# Patient Record
Sex: Female | Born: 1965 | Race: White | Hispanic: No | Marital: Married | Smoking: Former smoker
Health system: Southern US, Community
[De-identification: ages and names within clinical notes are randomized; demographics above are authoritative.]

## PROBLEM LIST (undated history)

## (undated) DIAGNOSIS — I319 Disease of pericardium, unspecified: Secondary | ICD-10-CM

## (undated) DIAGNOSIS — J3089 Other allergic rhinitis: Secondary | ICD-10-CM

## (undated) DIAGNOSIS — C3490 Malignant neoplasm of unspecified part of unspecified bronchus or lung: Secondary | ICD-10-CM

## (undated) DIAGNOSIS — Z8619 Personal history of other infectious and parasitic diseases: Secondary | ICD-10-CM

## (undated) DIAGNOSIS — K635 Polyp of colon: Secondary | ICD-10-CM

## (undated) HISTORY — DX: Other allergic rhinitis: J30.89

## (undated) HISTORY — DX: Malignant neoplasm of unspecified part of unspecified bronchus or lung: C34.90

## (undated) HISTORY — DX: Personal history of other infectious and parasitic diseases: Z86.19

## (undated) HISTORY — DX: Polyp of colon: K63.5

---
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
## 2007-09-25 HISTORY — PX: OTHER SURGICAL HISTORY: SHX169

## 2013-04-01 ENCOUNTER — Ambulatory Visit: Payer: Self-pay | Admitting: Internal Medicine

## 2013-05-06 ENCOUNTER — Ambulatory Visit: Payer: Self-pay | Admitting: Physical Medicine and Rehabilitation

## 2015-12-05 ENCOUNTER — Ambulatory Visit: Payer: 59 | Admitting: Nurse Practitioner

## 2016-01-13 ENCOUNTER — Encounter: Payer: Self-pay | Admitting: Nurse Practitioner

## 2016-01-13 ENCOUNTER — Other Ambulatory Visit: Payer: Self-pay | Admitting: Nurse Practitioner

## 2016-01-13 ENCOUNTER — Ambulatory Visit (INDEPENDENT_AMBULATORY_CARE_PROVIDER_SITE_OTHER): Payer: 59 | Admitting: Nurse Practitioner

## 2016-01-13 VITALS — BP 112/74 | HR 96 | Temp 98.1°F | Ht 64.25 in | Wt 169.3 lb

## 2016-01-13 DIAGNOSIS — C3490 Malignant neoplasm of unspecified part of unspecified bronchus or lung: Secondary | ICD-10-CM | POA: Insufficient documentation

## 2016-01-13 DIAGNOSIS — Z7189 Other specified counseling: Secondary | ICD-10-CM

## 2016-01-13 DIAGNOSIS — Z91048 Other nonmedicinal substance allergy status: Secondary | ICD-10-CM | POA: Diagnosis not present

## 2016-01-13 DIAGNOSIS — N842 Polyp of vagina: Secondary | ICD-10-CM

## 2016-01-13 DIAGNOSIS — G479 Sleep disorder, unspecified: Secondary | ICD-10-CM | POA: Insufficient documentation

## 2016-01-13 DIAGNOSIS — Z5189 Encounter for other specified aftercare: Secondary | ICD-10-CM

## 2016-01-13 DIAGNOSIS — Z7689 Persons encountering health services in other specified circumstances: Secondary | ICD-10-CM

## 2016-01-13 DIAGNOSIS — Z9109 Other allergy status, other than to drugs and biological substances: Secondary | ICD-10-CM

## 2016-01-13 DIAGNOSIS — Z923 Personal history of irradiation: Secondary | ICD-10-CM

## 2016-01-13 MED ORDER — LORAZEPAM 1 MG PO TABS: 1.0000 mg | ORAL_TABLET | Freq: Every day | ORAL | Status: AC

## 2016-01-13 NOTE — Addendum Note (Signed)
Addended by: Rubbie Battiest on: 01/13/2016 09:26 AM   Modules accepted: Orders

## 2016-01-13 NOTE — Assessment & Plan Note (Signed)
Removed in 2014 No concerns recently

## 2016-01-13 NOTE — Progress Notes (Signed)
Pre visit review using our clinic review tool, if applicable. No additional management support is needed unless otherwise documented below in the visit note. 

## 2016-01-13 NOTE — Assessment & Plan Note (Signed)
OTCs, stable, no further concerns at this time

## 2016-01-13 NOTE — Assessment & Plan Note (Addendum)
Discussed acute and chronic issues. Reviewed health maintenance measures, PFSHx, and immunizations. Obtain records and some labs

## 2016-01-13 NOTE — Assessment & Plan Note (Signed)
Seen by Dr. Yates Decamp at [facility] in [place] Left lung Left chest port  Referral to onc here for intermittent flushing ONLY FU prn worsening/failure to improve.

## 2016-01-13 NOTE — Progress Notes (Signed)
Patient ID: Becky Miller, female    DOB: September 11, 1966  Age: 50 y.o. MRN: [id number]  CC: New Patient (Initial Visit)   HPI Becky Miller presents for establishing care today and CC refills and labs.   1) New Pt Info:   Immunizations-tdap 2009 first surgery- back   Mammogram- Wants to discuss (Pet scans) goes back in June, small cell lung cancer  Pap- 2014- normals only   Colonoscopy- covered in PET scan 2017, unknown date in past   Last eye exam was 2015   LMP- 2013 Post-menopausal  2) Chronic Problems-  Cancer- Lung Cancer, chemo and radiation completion   Allergies- Seasonal   Polyps in the colon- colonoscopy, vaginal polyp in 2014- removed also   3) Acute Problems-  Thyroid- it was not normal recently  B12- vegetarian, from neck surgery tingling   Needs Ativan refilled, uses 1 at night for sleep   Will need port flushed occasionally  Seeing Dr. Yates Decamp at the [facility] in [place] every 2 months.   History Becky Miller has a past medical history of Lung cancer ([place]); History of chicken pox; Environmental and seasonal allergies; and Polyp of colon.   She has past surgical history that includes dicectomy (2003); Lamectomy (2009); and Tonsillectomy and adenoidectomy (1981).   Her family history includes Hyperlipidemia in her father and mother; Hypertension in her father and mother; Kidney disease in her brother; Stroke in her brother.She reports that she quit smoking about 5 years ago. She does not have any smokeless tobacco history on file. She reports that she does not use illicit drugs. Her alcohol history is not on file.  No outpatient prescriptions prior to visit.   No facility-administered medications prior to visit.    ROS Review of Systems  Constitutional: Negative for fever, chills, diaphoresis and fatigue.  Respiratory: Negative for chest tightness, shortness of breath and wheezing.   Cardiovascular: Negative for chest pain, palpitations and leg swelling.   Gastrointestinal: Negative for nausea, vomiting and diarrhea.  Skin: Negative for rash.  Neurological: Positive for numbness. Negative for dizziness, weakness and headaches.       From previous surgeries back/neck and radiation  Psychiatric/Behavioral: Positive for sleep disturbance. The patient is not nervous/anxious.     Objective:  BP 112/74 mmHg  Pulse 96  Temp(Src) 98.1 F (36.7 C) (Oral)  Ht 5' 4.25" (1.632 m)  Wt 169 lb 5 oz (76.8 kg)  BMI 28.84 kg/m2  SpO2 96%  LMP 09/25/2011 (Approximate) Repeat pulse 96  Physical Exam  Constitutional: She is oriented to person, place, and time. She appears well-developed and well-nourished. No distress.  HENT:  Head: Normocephalic and atraumatic.  Right Ear: External ear normal.  Left Ear: External ear normal.  Loss of hair  Eyes: Right eye exhibits no discharge. Left eye exhibits no discharge. No scleral icterus.  Glasses  Cardiovascular: Normal rate, regular rhythm and normal heart sounds.   Pulmonary/Chest: Effort normal and breath sounds normal. No respiratory distress. She has no wheezes. She has no rales. She exhibits no tenderness.  Neurological: She is alert and oriented to person, place, and time. No cranial nerve deficit. She exhibits normal muscle tone. Coordination normal.  Skin: Skin is warm and dry. No rash noted. She is not diaphoretic.  Psychiatric: She has a normal mood and affect. Her behavior is normal. Judgment and thought content normal.   Assessment & Plan:   Becky Miller was seen today for new patient (initial visit).  Diagnoses and all orders for this  visit:  Encounter to establish care  Small cell lung cancer, unspecified laterality ([place]) Comments: Left lung Orders: -     Lipid Profile -     CBC w/Diff -     TSH -     T4, free -     B12 -     Comp Met (CMET) -     Ambulatory referral to Oncology  Environmental allergies  Vaginal polyp  S/P radiation therapy  Sleep disturbance  Other orders -      Cancel: Basic Metabolic Panel (BMET) -     Cancel: Urinalysis, Routine w reflex microscopic -     Cancel: Hepatic function panel -     LORazepam (ATIVAN) 1 MG tablet; Take 1 tablet (1 mg total) by mouth at bedtime.   I have changed Becky Miller's LORazepam. I am also having her maintain her memantine.  Meds ordered this encounter  Medications  . DISCONTD: LORazepam (ATIVAN) 1 MG tablet    Sig:   . memantine (NAMENDA) 10 MG tablet    Sig:   . LORazepam (ATIVAN) 1 MG tablet    Sig: Take 1 tablet (1 mg total) by mouth at bedtime.    Dispense:  30 tablet    Refill:  2    Order Specific Question:  Supervising Provider    Answer:  Crecencio Mc [[id number]]     Follow-up: Return in about 3 months (around 04/13/2016) for Follow up .

## 2016-01-13 NOTE — Patient Instructions (Signed)
Welcome to [facility]! Nice to meet you.   See you in 3 months. We will put in a "referral" for as needed port flushing.

## 2016-01-13 NOTE — Assessment & Plan Note (Signed)
Stable on Ativan 1 mg at night Will continue x 3 months CSC signed today FU prn worsening/failure to improve.

## 2016-01-14 LAB — COMPREHENSIVE METABOLIC PANEL
ALBUMIN: 4.2 g/dL (ref 3.5–5.5)
ALT: 11 IU/L (ref 0–32)
AST: 14 IU/L (ref 0–40)
Albumin/Globulin Ratio: 1.3 (ref 1.2–2.2)
Alkaline Phosphatase: 84 IU/L (ref 39–117)
BUN / CREAT RATIO: 20 (ref 9–23)
BUN: 11 mg/dL (ref 6–24)
Bilirubin Total: <0.2 mg/dL (ref 0.0–1.2)
CO2: 21 mmol/L (ref 18–29)
CREATININE: 0.56 mg/dL — AB (ref 0.57–1.00)
Calcium: 9.8 mg/dL (ref 8.7–10.2)
Chloride: 98 mmol/L (ref 96–106)
GFR, EST AFRICAN AMERICAN: 124 mL/min/{1.73_m2} (ref >59)
GFR, EST NON AFRICAN AMERICAN: 108 mL/min/{1.73_m2} (ref >59)
GLOBULIN, TOTAL: 3.2 g/dL (ref 1.5–4.5)
Glucose: 105 mg/dL — ABNORMAL HIGH (ref 65–99)
Potassium: 4.9 mmol/L (ref 3.5–5.2)
SODIUM: 136 mmol/L (ref 134–144)
TOTAL PROTEIN: 7.4 g/dL (ref 6.0–8.5)

## 2016-01-14 LAB — CBC WITH DIFFERENTIAL/PLATELET
BASOS ABS: 0 10*3/uL (ref 0.0–0.2)
Basos: 1 %
EOS (ABSOLUTE): 0.2 10*3/uL (ref 0.0–0.4)
Eos: 3 %
Hematocrit: 34.3 % (ref 34.0–46.6)
Hemoglobin: 11.1 g/dL (ref 11.1–15.9)
IMMATURE GRANS (ABS): 0 10*3/uL (ref 0.0–0.1)
IMMATURE GRANULOCYTES: 0 %
LYMPHS: 13 %
Lymphocytes Absolute: 0.9 10*3/uL (ref 0.7–3.1)
MCH: 27.8 pg (ref 26.6–33.0)
MCHC: 32.4 g/dL (ref 31.5–35.7)
MCV: 86 fL (ref 79–97)
MONOCYTES: 8 %
Monocytes Absolute: 0.5 10*3/uL (ref 0.1–0.9)
NEUTROS PCT: 75 %
Neutrophils Absolute: 5.3 10*3/uL (ref 1.4–7.0)
PLATELETS: 361 10*3/uL (ref 150–379)
RBC: 3.99 x10E6/uL (ref 3.77–5.28)
RDW: 14.1 % (ref 12.3–15.4)
WBC: 7 10*3/uL (ref 3.4–10.8)

## 2016-01-14 LAB — CBC AND DIFFERENTIAL
HCT: 34 % — AB (ref 36–46)
Hemoglobin: 11.1 g/dL — AB (ref 12.0–16.0)
NEUTROS ABS: 5 /uL
PLATELETS: 361 10*3/uL (ref 150–399)
WBC: 7 10^3/mL

## 2016-01-14 LAB — BASIC METABOLIC PANEL
BUN: 11 mg/dL (ref 4–21)
Creatinine: 0.6 mg/dL (ref 0.5–1.1)
GLUCOSE: 105 mg/dL
Potassium: 4.9 mmol/L (ref 3.4–5.3)
Sodium: 136 mmol/L — AB (ref 137–147)

## 2016-01-14 LAB — TSH
TSH: 2.34 u[IU]/mL (ref 0.41–5.90)
TSH: 2.34 u[IU]/mL (ref 0.450–4.500)

## 2016-01-14 LAB — HEPATIC FUNCTION PANEL
ALK PHOS: 84 U/L (ref 25–125)
ALT: 11 U/L (ref 7–35)
AST: 14 U/L (ref 13–35)
BILIRUBIN, TOTAL: 0.2 mg/dL

## 2016-01-14 LAB — LIPID PANEL W/O CHOL/HDL RATIO
Cholesterol, Total: 263 mg/dL — ABNORMAL HIGH (ref 100–199)
HDL: 44 mg/dL (ref >39)
LDL Calculated: 200 mg/dL — ABNORMAL HIGH (ref 0–99)
Triglycerides: 96 mg/dL (ref 0–149)
VLDL CHOLESTEROL CAL: 19 mg/dL (ref 5–40)

## 2016-01-14 LAB — B12 AND FOLATE PANEL
FOLATE: 16.7 ng/mL (ref >3.0)
Vitamin B-12: 303 pg/mL (ref 211–946)

## 2016-01-14 LAB — LIPID PANEL
Cholesterol: 263 mg/dL — AB (ref 0–200)
HDL: 44 mg/dL (ref 35–70)
LDL CALC: 200 mg/dL
TRIGLYCERIDES: 96 mg/dL (ref 40–160)

## 2016-01-14 LAB — T3: T3, Total: 118 ng/dL (ref 71–180)

## 2016-01-14 LAB — T4, FREE: FREE T4: 0.99 ng/dL (ref 0.82–1.77)

## 2016-01-17 ENCOUNTER — Telehealth: Payer: Self-pay | Admitting: Nurse Practitioner

## 2016-01-17 NOTE — Telephone Encounter (Signed)
No labs found in epic or in box

## 2016-01-17 NOTE — Telephone Encounter (Signed)
Pt called in about wanting to follow up on her lab results she got labs done on 04/21. Call pt @ [phone]. Thank you!

## 2016-01-18 ENCOUNTER — Ambulatory Visit: Payer: 59 | Admitting: Oncology

## 2016-01-18 ENCOUNTER — Ambulatory Visit (INDEPENDENT_AMBULATORY_CARE_PROVIDER_SITE_OTHER): Payer: 59 | Admitting: Nurse Practitioner

## 2016-01-18 ENCOUNTER — Encounter: Payer: Self-pay | Admitting: Nurse Practitioner

## 2016-01-18 VITALS — BP 98/68 | HR 104 | Temp 98.0°F | Ht 64.25 in | Wt 169.1 lb

## 2016-01-18 DIAGNOSIS — M5431 Sciatica, right side: Secondary | ICD-10-CM | POA: Diagnosis not present

## 2016-01-18 NOTE — Patient Instructions (Signed)
Aleve, Ice, Stretching... Chiropractor tomorrow   We will re-check your Cholesterol and thyroid panel in 6 months

## 2016-01-18 NOTE — Telephone Encounter (Signed)
I'm seeing her today- I will ask her what labs

## 2016-01-18 NOTE — Progress Notes (Signed)
Patient ID: Becky Miller, female    DOB: May 15, 1966  Age: 50 y.o. MRN: [id number]  CC: Hip Pain   HPI Becky Miller presents for CC of back pain x 1 day.   1) Right leg pain   Chiropractor tomorrow  Oxycodone- somewhat helpful   Advil- helpful  In bed until Lunch time, stretching   3/4 severity Right lower back to hip and then wraps knee down to foot   Repeat cholesterol and thyroid panel in 6 months    History Becky Miller has a past medical history of Lung cancer ([place]); History of chicken pox; Environmental and seasonal allergies; and Polyp of colon.   She has past surgical history that includes dicectomy (2003); Lamectomy (2009); and Tonsillectomy and adenoidectomy (1981).   Her family history includes Hyperlipidemia in her father and mother; Hypertension in her father and mother; Kidney disease in her brother; Stroke in her brother.She reports that she quit smoking about 5 years ago. She does not have any smokeless tobacco history on file. She reports that she does not use illicit drugs. Her alcohol history is not on file.  Outpatient Prescriptions Prior to Visit  Medication Sig Dispense Refill  . LORazepam (ATIVAN) 1 MG tablet Take 1 tablet (1 mg total) by mouth at bedtime. 30 tablet 2  . memantine (NAMENDA) 10 MG tablet      No facility-administered medications prior to visit.    ROS Review of Systems  Constitutional: Negative for fever, chills, diaphoresis and fatigue.  Respiratory: Negative for chest tightness, shortness of breath and wheezing.   Cardiovascular: Negative for chest pain, palpitations and leg swelling.  Gastrointestinal: Negative for nausea, vomiting and diarrhea.  Musculoskeletal: Positive for myalgias and back pain. Negative for joint swelling, arthralgias, gait problem, neck pain and neck stiffness.  Skin: Negative for rash.  Neurological: Negative for dizziness, weakness, numbness and headaches.  Psychiatric/Behavioral: The patient is not nervous/anxious.      Objective:  BP 98/68 mmHg  Pulse 104  Temp(Src) 98 F (36.7 C) (Oral)  Ht 5' 4.25" (1.632 m)  Wt 169 lb 1.9 oz (76.712 kg)  BMI 28.80 kg/m2  SpO2 96%  LMP 09/25/2011 (Approximate)  Physical Exam  Constitutional: She is oriented to person, place, and time. She appears well-developed and well-nourished. No distress.  HENT:  Head: Normocephalic and atraumatic.  Right Ear: External ear normal.  Left Ear: External ear normal.  Cardiovascular: Regular rhythm.   Slightly tachycardic  Pulmonary/Chest: Effort normal and breath sounds normal. No respiratory distress. She has no wheezes. She has no rales. She exhibits no tenderness.  Musculoskeletal: Normal range of motion. She exhibits no edema or tenderness.  Neurological: She is alert and oriented to person, place, and time. No cranial nerve deficit. She exhibits normal muscle tone. Coordination normal.  Iliopsoas 5/5 Bilateral, Tib anterior 5/5 bilateral, EHL 5/5 bilateral, no ankle clonus, intact heel/toe/sequential walking, sensation intact upper and lower extremities. Straight leg raise negative bilaterally.   Skin: Skin is warm and dry. No rash noted. She is not diaphoretic.  Psychiatric: She has a normal mood and affect. Her behavior is normal. Judgment and thought content normal.      Assessment & Plan:   Jalie was seen today for hip pain.  Diagnoses and all orders for this visit:  Back pain with right-sided sciatica  I am having Ms. Klutz maintain her memantine and LORazepam.  No orders of the defined types were placed in this encounter.     Follow-up: Return if symptoms  worsen or fail to improve.

## 2016-01-18 NOTE — Telephone Encounter (Signed)
Requested results from [facility]. Received & abstracted.

## 2016-01-25 NOTE — Assessment & Plan Note (Signed)
New onset Patient reports reports radiculopathy of right side Neuro exam was normal It is already improving per patient she would not like to seek further treatment at this time. She reports she is going to see the chiropractor tomorrow. Conservative therapy until then and follow-up if worsening.

## 2016-01-27 ENCOUNTER — Encounter: Payer: Self-pay | Admitting: Nurse Practitioner

## 2016-01-31 ENCOUNTER — Encounter: Payer: Self-pay | Admitting: Nurse Practitioner

## 2016-02-01 ENCOUNTER — Encounter: Payer: Self-pay | Admitting: Nurse Practitioner

## 2016-02-22 ENCOUNTER — Telehealth: Payer: Self-pay | Admitting: *Deleted

## 2016-02-22 NOTE — Telephone Encounter (Signed)
I spoke with the patient about her laboratory results. She informed me that she was having abdominal pain. I initially informed her that I thought that she could wait until Monday to be seen. She then informed me that she had acholic stool. I urged her to be seen as soon as possible in a local emergency department as she needs labs and a CT scan. Patient acknowledged.

## 2016-02-22 NOTE — Telephone Encounter (Signed)
Patient also wants a physician to review the results to see if there is something she should be concerned about before going to her Oncologist. Lab results are in Carrie's box.

## 2016-02-22 NOTE — Telephone Encounter (Signed)
Patient has requested a phone consult, she was in [place] over the weekend, she went to urgent care there.Her Liver  enzymes were elevated. She's having the labs faxed over along with Hep C results as well. [phone]

## 2016-02-22 NOTE — Telephone Encounter (Signed)
Lab given to Dr. Jonathon Jordan CMA she has a visit in the future with Dr. Lacinda Axon. thanks

## 2016-04-02 LAB — HEPATIC FUNCTION PANEL
ALK PHOS: 68 U/L (ref 25–125)
ALT: 25 U/L (ref 7–35)
AST: 22 U/L (ref 13–35)
Bilirubin, Total: 0.4 mg/dL

## 2016-04-02 LAB — BASIC METABOLIC PANEL
BUN: 9 mg/dL (ref 4–21)
CREATININE: 0.5 mg/dL (ref 0.5–1.1)
Glucose: 117 mg/dL
POTASSIUM: 4.2 mmol/L (ref 3.4–5.3)
Sodium: 135 mmol/L — AB (ref 137–147)

## 2016-04-02 LAB — CBC AND DIFFERENTIAL
HCT: 32 % — AB (ref 36–46)
HEMOGLOBIN: 10.5 g/dL — AB (ref 12.0–16.0)
Neutrophils Absolute: 6 /uL
Platelets: 227 10*3/uL (ref 150–399)
WBC: 8.5 10*3/mL

## 2016-04-05 ENCOUNTER — Telehealth: Payer: Self-pay | Admitting: Family Medicine

## 2016-04-05 NOTE — Telephone Encounter (Signed)
Please advise appt needed prior, thanks

## 2016-04-05 NOTE — Telephone Encounter (Signed)
Pt called wanting to get labs done. Need orders sent to [facility] [address] please and thank you! Need fasting labs. Or pt can pick up form.  Call pt @ [phone].

## 2016-04-05 NOTE — Telephone Encounter (Signed)
Please advise and order, thanks

## 2016-04-05 NOTE — Telephone Encounter (Signed)
Will need to be seen prior. I have not seen this patient.

## 2016-04-06 NOTE — Telephone Encounter (Signed)
Ok. Pt was notified and pt has a appt on 04/10/16.

## 2016-04-10 ENCOUNTER — Ambulatory Visit (INDEPENDENT_AMBULATORY_CARE_PROVIDER_SITE_OTHER): Payer: 59 | Admitting: Family Medicine

## 2016-04-10 ENCOUNTER — Encounter: Payer: Self-pay | Admitting: Family Medicine

## 2016-04-10 VITALS — BP 124/72 | HR 103 | Temp 97.8°F | Wt 152.5 lb

## 2016-04-10 DIAGNOSIS — Z0000 Encounter for general adult medical examination without abnormal findings: Secondary | ICD-10-CM | POA: Diagnosis not present

## 2016-04-10 NOTE — Patient Instructions (Signed)
Call with concerns/issues.  Continue your current meds.  Take care  Dr. Lacinda Axon  Health Maintenance, Female Adopting a healthy lifestyle and getting preventive care can go a long way to promote health and wellness. Talk with your health care provider about what schedule of regular examinations is right for you. This is a good chance for you to check in with your provider about disease prevention and staying healthy. In between checkups, there are plenty of things you can do on your own. Experts have done a lot of research about which lifestyle changes and preventive measures are most likely to keep you healthy. Ask your health care provider for more information. WEIGHT AND DIET  Eat a healthy diet  Be sure to include plenty of vegetables, fruits, low-fat dairy products, and lean protein.  Do not eat a lot of foods high in solid fats, added sugars, or salt.  Get regular exercise. This is one of the most important things you can do for your health.  Most adults should exercise for at least 150 minutes each week. The exercise should increase your heart rate and make you sweat (moderate-intensity exercise).  Most adults should also do strengthening exercises at least twice a week. This is in addition to the moderate-intensity exercise.  Maintain a healthy weight  Body mass index (BMI) is a measurement that can be used to identify possible weight problems. It estimates body fat based on height and weight. Your health care provider can help determine your BMI and help you achieve or maintain a healthy weight.  For females 52 years of age and older:   A BMI below 18.5 is considered underweight.  A BMI of 18.5 to 24.9 is normal.  A BMI of 25 to 29.9 is considered overweight.  A BMI of 30 and above is considered obese.  Watch levels of cholesterol and blood lipids  You should start having your blood tested for lipids and cholesterol at 52 years of age, then have this test every 5  years.  You may need to have your cholesterol levels checked more often if:  Your lipid or cholesterol levels are high.  You are older than 52 years of age.  You are at high risk for heart disease.  CANCER SCREENING   Lung Cancer  Lung cancer screening is recommended for adults 52-3 years old who are at high risk for lung cancer because of a history of smoking.  A yearly low-dose CT scan of the lungs is recommended for people who:  Currently smoke.  Have quit within the past 15 years.  Have at least a 30-pack-year history of smoking. A pack year is smoking an average of one pack of cigarettes a day for 1 year.  Yearly screening should continue until it has been 15 years since you quit.  Yearly screening should stop if you develop a health problem that would prevent you from having lung cancer treatment.  Breast Cancer  Practice breast self-awareness. This means understanding how your breasts normally appear and feel.  It also means doing regular breast self-exams. Let your health care provider know about any changes, no matter how small.  If you are in your 20s or 30s, you should have a clinical breast exam (CBE) by a health care provider every 1-3 years as part of a regular health exam.  If you are 35 or older, have a CBE every year. Also consider having a breast X-ray (mammogram) every year.  If you have a family history  of breast cancer, talk to your health care provider about genetic screening.  If you are at high risk for breast cancer, talk to your health care provider about having an MRI and a mammogram every year.  Breast cancer gene (BRCA) assessment is recommended for women who have family members with BRCA-related cancers. BRCA-related cancers include:  Breast.  Ovarian.  Tubal.  Peritoneal cancers.  Results of the assessment will determine the need for genetic counseling and BRCA1 and BRCA2 testing. Cervical Cancer Your health care provider may  recommend that you be screened regularly for cancer of the pelvic organs (ovaries, uterus, and vagina). This screening involves a pelvic examination, including checking for microscopic changes to the surface of your cervix (Pap test). You may be encouraged to have this screening done every 3 years, beginning at age 52.  For women ages 69-65, health care providers may recommend pelvic exams and Pap testing every 3 years, or they may recommend the Pap and pelvic exam, combined with testing for human papilloma virus (HPV), every 5 years. Some types of HPV increase your risk of cervical cancer. Testing for HPV may also be done on women of any age with unclear Pap test results.  Other health care providers may not recommend any screening for nonpregnant women who are considered low risk for pelvic cancer and who do not have symptoms. Ask your health care provider if a screening pelvic exam is right for you.  If you have had past treatment for cervical cancer or a condition that could lead to cancer, you need Pap tests and screening for cancer for at least 20 years after your treatment. If Pap tests have been discontinued, your risk factors (such as having a new sexual partner) need to be reassessed to determine if screening should resume. Some women have medical problems that increase the chance of getting cervical cancer. In these cases, your health care provider may recommend more frequent screening and Pap tests. Colorectal Cancer  This type of cancer can be detected and often prevented.  Routine colorectal cancer screening usually begins at 52 years of age and continues through 52 years of age.  Your health care provider may recommend screening at an earlier age if you have risk factors for colon cancer.  Your health care provider may also recommend using home test kits to check for hidden blood in the stool.  A small camera at the end of a tube can be used to examine your colon directly  (sigmoidoscopy or colonoscopy). This is done to check for the earliest forms of colorectal cancer.  Routine screening usually begins at age 52.  Direct examination of the colon should be repeated every 5-10 years through 52 years of age. However, you may need to be screened more often if early forms of precancerous polyps or small growths are found. Skin Cancer  Check your skin from head to toe regularly.  Tell your health care provider about any new moles or changes in moles, especially if there is a change in a mole's shape or color.  Also tell your health care provider if you have a mole that is larger than the size of a pencil eraser.  Always use sunscreen. Apply sunscreen liberally and repeatedly throughout the day.  Protect yourself by wearing long sleeves, pants, a wide-brimmed hat, and sunglasses whenever you are outside. HEART DISEASE, DIABETES, AND HIGH BLOOD PRESSURE   High blood pressure causes heart disease and increases the risk of stroke. High blood pressure is  more likely to develop in:  People who have blood pressure in the high end of the normal range (130-139/85-89 mm Hg).  People who are overweight or obese.  People who are African American.  If you are 56-37 years of age, have your blood pressure checked every 3-5 years. If you are 35 years of age or older, have your blood pressure checked every year. You should have your blood pressure measured twice--once when you are at a hospital or clinic, and once when you are not at a hospital or clinic. Record the average of the two measurements. To check your blood pressure when you are not at a hospital or clinic, you can use:  An automated blood pressure machine at a pharmacy.  A home blood pressure monitor.  If you are between 43 years and 20 years old, ask your health care provider if you should take aspirin to prevent strokes.  Have regular diabetes screenings. This involves taking a blood sample to check your  fasting blood sugar level.  If you are at a normal weight and have a low risk for diabetes, have this test once every three years after 52 years of age.  If you are overweight and have a high risk for diabetes, consider being tested at a younger age or more often. PREVENTING INFECTION  Hepatitis B  If you have a higher risk for hepatitis B, you should be screened for this virus. You are considered at high risk for hepatitis B if:  You were born in a country where hepatitis B is common. Ask your health care provider which countries are considered high risk.  Your parents were born in a high-risk country, and you have not been immunized against hepatitis B (hepatitis B vaccine).  You have HIV or AIDS.  You use needles to inject street drugs.  You live with someone who has hepatitis B.  You have had sex with someone who has hepatitis B.  You get hemodialysis treatment.  You take certain medicines for conditions, including cancer, organ transplantation, and autoimmune conditions. Hepatitis C  Blood testing is recommended for:  Everyone born from [birth year] through 1967.  Anyone with known risk factors for hepatitis C. Sexually transmitted infections (STIs)  You should be screened for sexually transmitted infections (STIs) including gonorrhea and chlamydia if:  You are sexually active and are younger than 52 years of age.  You are older than 52 years of age and your health care provider tells you that you are at risk for this type of infection.  Your sexual activity has changed since you were last screened and you are at an increased risk for chlamydia or gonorrhea. Ask your health care provider if you are at risk.  If you do not have HIV, but are at risk, it may be recommended that you take a prescription medicine daily to prevent HIV infection. This is called pre-exposure prophylaxis (PrEP). You are considered at risk if:  You are sexually active and do not regularly use condoms or  know the HIV status of your partner(s).  You take drugs by injection.  You are sexually active with a partner who has HIV. Talk with your health care provider about whether you are at high risk of being infected with HIV. If you choose to begin PrEP, you should first be tested for HIV. You should then be tested every 3 months for as long as you are taking PrEP.  PREGNANCY   If you are premenopausal and you  may become pregnant, ask your health care provider about preconception counseling.  If you may become pregnant, take 400 to 800 micrograms (mcg) of folic acid every day.  If you want to prevent pregnancy, talk to your health care provider about birth control (contraception). OSTEOPOROSIS AND MENOPAUSE   Osteoporosis is a disease in which the bones lose minerals and strength with aging. This can result in serious bone fractures. Your risk for osteoporosis can be identified using a bone density scan.  If you are 6 years of age or older, or if you are at risk for osteoporosis and fractures, ask your health care provider if you should be screened.  Ask your health care provider whether you should take a calcium or vitamin D supplement to lower your risk for osteoporosis.  Menopause may have certain physical symptoms and risks.  Hormone replacement therapy may reduce some of these symptoms and risks. Talk to your health care provider about whether hormone replacement therapy is right for you.  HOME CARE INSTRUCTIONS   Schedule regular health, dental, and eye exams.  Stay current with your immunizations.   Do not use any tobacco products including cigarettes, chewing tobacco, or electronic cigarettes.  If you are pregnant, do not drink alcohol.  If you are breastfeeding, limit how much and how often you drink alcohol.  Limit alcohol intake to no more than 1 drink per day for nonpregnant women. One drink equals 12 ounces of beer, 5 ounces of wine, or 1 ounces of hard liquor.  Do  not use street drugs.  Do not share needles.  Ask your health care provider for help if you need support or information about quitting drugs.  Tell your health care provider if you often feel depressed.  Tell your health care provider if you have ever been abused or do not feel safe at home.   This information is not intended to replace advice given to you by your health care provider. Make sure you discuss any questions you have with your health care provider.   Document Released: 03/26/2011 Document Revised: 10/01/2014 Document Reviewed: 08/12/2013 Elsevier Interactive Patient Education [employer].

## 2016-04-10 NOTE — Progress Notes (Signed)
Pre visit review using our clinic review tool, if applicable. No additional management support is needed unless otherwise documented below in the visit note. 

## 2016-04-11 ENCOUNTER — Encounter: Payer: Self-pay | Admitting: Family Medicine

## 2016-04-11 DIAGNOSIS — Z0000 Encounter for general adult medical examination without abnormal findings: Secondary | ICD-10-CM | POA: Insufficient documentation

## 2016-04-11 NOTE — Progress Notes (Signed)
Subjective:  Patient ID: Becky Miller, female    DOB: 05-17-1966  Age: 50 y.o. MRN: [id number]  CC: Establish care (with me); Annual exam.  HPI Becky Miller is a 50 y.o. female presents to the clinic today to establish care with me. She has no concerns today. 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 She is followed by the [facility] in [place].   Preventative Healthcare  Pap smear: In need of.  Mammogram: In need of.  Colonoscopy: Due for.  Immunizations  Tetanus - Up to date.  Pneumococcal - Candidate for.  Hepatitis C screening - Done.   Labs: Has had recent labs.  Smoking/tobacco use: Former smoker.   PMH, Surgical Hx, Family Hx, Social History reviewed and updated as below.  Past Medical History  Diagnosis Date  . Lung cancer ([place])   . History of chicken pox   . Environmental and seasonal allergies   . Polyp of colon    Past Surgical History  Procedure Laterality Date  . Lamectomy  2009  . Tonsillectomy and adenoidectomy  1981   Family History  Problem Relation Age of Onset  . Colon cancer      Parents  . Hypercholesterolemia      Parents  . Hypertension      Parents  . Kidney disease Brother   . Stroke Brother   . Hyperlipidemia Mother   . Hyperlipidemia Father   . Hypertension Mother   . Hypertension Father    Social History  Substance Use Topics  . Smoking status: Former Smoker    Quit date: 09/24/2010  . Smokeless tobacco: Not on file  . Alcohol Use: Not on file   Review of Systems  Constitutional: Positive for fever.  Gastrointestinal: Positive for abdominal pain.  All other systems reviewed and are negative.  Objective:   Today's Vitals: BP 124/72 mmHg  Pulse 103  Temp(Src) 97.8 F (36.6 C) (Oral)  Wt 152 lb 8 oz (69.174 kg)  SpO2 97%  LMP 09/25/2011 (Approximate)  Physical Exam  Constitutional: She is oriented to person, place, and time. She appears well-developed.  NAD. Does  appear fatigued.   HENT:  Head: Normocephalic and atraumatic.  Eyes: Conjunctivae are normal. No scleral icterus.  Neck: Normal range of motion.  Cardiovascular: Normal rate and regular rhythm.   Pulmonary/Chest: Effort normal. She has no wheezes. She has no rales.  Abdominal: Soft. She exhibits no distension.  Tender to palpation in the epigastric region.  Musculoskeletal: Normal range of motion.  Neurological: She is alert and oriented to person, place, and time.  Psychiatric: She has a normal mood and affect.  Vitals reviewed.  Assessment & Plan:   Problem List Items Addressed This Visit    Annual physical exam - Primary    Patient had a long discussion about preventative healthcare items. She would like to wait and discuss these with her oncologist. I'm in agreement as I'm not sure if these are truly needed at this point given pancreatic metastasis. Patient to follow-up with me annually and PRN as needed. Additionally, patient is traveling to and from [place] often to get her treatments and care. I encouraged her to consolidate her care and essentially stay in one place as travel puts her at risk and is going to be difficult down the road.         Outpatient Encounter Prescriptions as of 04/10/2016  Medication Sig  . LORazepam (ATIVAN) 1 MG tablet  Take 1 tablet (1 mg total) by mouth at bedtime.  Marland Kitchen omeprazole (PRILOSEC) 40 MG capsule   . ondansetron (ZOFRAN-ODT) 8 MG disintegrating tablet   . oxyCODONE (OXY IR/ROXICODONE) 5 MG immediate release tablet   . polyethylene glycol powder (GLYCOLAX/MIRALAX) powder   . prochlorperazine (COMPAZINE) 10 MG tablet Take 10 mg by mouth every 6 (six) hours as needed for nausea or vomiting.  . senna (SENOKOT) 8.6 MG TABS tablet Take 2 tablets by mouth.  . [DISCONTINUED] memantine (NAMENDA) 10 MG tablet    No facility-administered encounter medications on file as of 04/10/2016.    Follow-up: Annually, PRN.  Amelia

## 2016-04-11 NOTE — Assessment & Plan Note (Signed)
Patient had a long discussion about preventative healthcare items. She would like to wait and discuss these with her oncologist. I'm in agreement as I'm not sure if these are truly needed at this point given pancreatic metastasis. Patient to follow-up with me annually and PRN as needed. Additionally, patient is traveling to and from [place] often to get her treatments and care. I encouraged her to consolidate her care and essentially stay in one place as travel puts her at risk and is going to be difficult down the road.

## 2016-04-13 ENCOUNTER — Encounter: Payer: Self-pay | Admitting: Family Medicine

## 2016-04-16 ENCOUNTER — Ambulatory Visit: Payer: 59 | Admitting: Family Medicine

## 2016-12-12 ENCOUNTER — Emergency Department: Payer: 59

## 2016-12-12 ENCOUNTER — Observation Stay (HOSPITAL_BASED_OUTPATIENT_CLINIC_OR_DEPARTMENT_OTHER)
Admit: 2016-12-12 | Discharge: 2016-12-12 | Disposition: A | Discharge status: Home / Self Care | Payer: 59 | Attending: Internal Medicine | Admitting: Internal Medicine

## 2016-12-12 ENCOUNTER — Observation Stay
Admission: EM | Admit: 2016-12-12 | Discharge: 2016-12-13 | Disposition: A | Discharge status: Home / Self Care | Payer: 59 | Attending: Internal Medicine | Admitting: Internal Medicine

## 2016-12-12 DIAGNOSIS — Z79899 Other long term (current) drug therapy: Secondary | ICD-10-CM | POA: Diagnosis not present

## 2016-12-12 DIAGNOSIS — Z87891 Personal history of nicotine dependence: Secondary | ICD-10-CM | POA: Diagnosis not present

## 2016-12-12 DIAGNOSIS — R0789 Other chest pain: Principal | ICD-10-CM | POA: Diagnosis present

## 2016-12-12 DIAGNOSIS — C3490 Malignant neoplasm of unspecified part of unspecified bronchus or lung: Secondary | ICD-10-CM | POA: Diagnosis not present

## 2016-12-12 DIAGNOSIS — C7889 Secondary malignant neoplasm of other digestive organs: Secondary | ICD-10-CM | POA: Insufficient documentation

## 2016-12-12 DIAGNOSIS — C7951 Secondary malignant neoplasm of bone: Secondary | ICD-10-CM

## 2016-12-12 DIAGNOSIS — I10 Essential (primary) hypertension: Secondary | ICD-10-CM | POA: Diagnosis not present

## 2016-12-12 DIAGNOSIS — I319 Disease of pericardium, unspecified: Secondary | ICD-10-CM | POA: Diagnosis not present

## 2016-12-12 DIAGNOSIS — I2510 Atherosclerotic heart disease of native coronary artery without angina pectoris: Secondary | ICD-10-CM | POA: Insufficient documentation

## 2016-12-12 DIAGNOSIS — Z923 Personal history of irradiation: Secondary | ICD-10-CM | POA: Insufficient documentation

## 2016-12-12 DIAGNOSIS — Z79891 Long term (current) use of opiate analgesic: Secondary | ICD-10-CM | POA: Insufficient documentation

## 2016-12-12 DIAGNOSIS — I25111 Atherosclerotic heart disease of native coronary artery with angina pectoris with documented spasm: Secondary | ICD-10-CM | POA: Diagnosis not present

## 2016-12-12 DIAGNOSIS — R079 Chest pain, unspecified: Secondary | ICD-10-CM | POA: Diagnosis not present

## 2016-12-12 HISTORY — DX: Disease of pericardium, unspecified: I31.9

## 2016-12-12 HISTORY — DX: Malignant neoplasm of unspecified part of unspecified bronchus or lung: C34.90

## 2016-12-12 LAB — TROPONIN I
TROPONIN I: 0.03 ng/mL — AB (ref <0.03)
Troponin I: <0.03 ng/mL (ref <0.03)

## 2016-12-12 LAB — SEDIMENTATION RATE: SED RATE: 25 mm/h (ref 0–30)

## 2016-12-12 LAB — MAGNESIUM: MAGNESIUM: 1.8 mg/dL (ref 1.7–2.4)

## 2016-12-12 LAB — BASIC METABOLIC PANEL
Anion gap: 8 (ref 5–15)
BUN: 17 mg/dL (ref 6–20)
CALCIUM: 9.5 mg/dL (ref 8.9–10.3)
CO2: 26 mmol/L (ref 22–32)
CREATININE: 0.64 mg/dL (ref 0.44–1.00)
Chloride: 105 mmol/L (ref 101–111)
GFR calc Af Amer: >60 mL/min (ref >60)
GFR calc non Af Amer: >60 mL/min (ref >60)
GLUCOSE: 111 mg/dL — AB (ref 65–99)
Potassium: 4.6 mmol/L (ref 3.5–5.1)
Sodium: 139 mmol/L (ref 135–145)

## 2016-12-12 LAB — CBC
HCT: 35 % (ref 35.0–47.0)
Hemoglobin: 11.4 g/dL — ABNORMAL LOW (ref 12.0–16.0)
MCH: 26.1 pg (ref 26.0–34.0)
MCHC: 32.6 g/dL (ref 32.0–36.0)
MCV: 79.9 fL — ABNORMAL LOW (ref 80.0–100.0)
PLATELETS: 328 10*3/uL (ref 150–440)
RBC: 4.39 MIL/uL (ref 3.80–5.20)
RDW: 20.6 % — ABNORMAL HIGH (ref 11.5–14.5)
WBC: 8.6 10*3/uL (ref 3.6–11.0)

## 2016-12-12 LAB — C-REACTIVE PROTEIN: CRP: <0.8 mg/dL (ref <1.0)

## 2016-12-12 LAB — TSH: TSH: 1.35 u[IU]/mL (ref 0.350–4.500)

## 2016-12-12 MED ORDER — SODIUM CHLORIDE 0.9 % IV SOLN: 250.0000 mL | INTRAVENOUS | Status: DC | PRN

## 2016-12-12 MED ORDER — ACETAMINOPHEN 650 MG RE SUPP: 650.0000 mg | Freq: Four times a day (QID) | RECTAL | Status: DC | PRN

## 2016-12-12 MED ORDER — ASPIRIN 81 MG PO CHEW
324.0000 mg | CHEWABLE_TABLET | Freq: Once | ORAL | Status: AC
  Administered 2016-12-12: 324 mg via ORAL
  Filled 2016-12-12: qty 4

## 2016-12-12 MED ORDER — COLCHICINE 0.6 MG PO TABS
0.6000 mg | ORAL_TABLET | Freq: Two times a day (BID) | ORAL | Status: DC
  Administered 2016-12-12 – 2016-12-13 (×2): 0.6 mg via ORAL
  Filled 2016-12-12 (×2): qty 1

## 2016-12-12 MED ORDER — ENOXAPARIN SODIUM 40 MG/0.4ML ~~LOC~~ SOLN
40.0000 mg | SUBCUTANEOUS | Status: DC
  Administered 2016-12-12: 40 mg via SUBCUTANEOUS
  Filled 2016-12-12: qty 0.4

## 2016-12-12 MED ORDER — PREDNISONE 10 MG PO TABS
10.0000 mg | ORAL_TABLET | Freq: Every day | ORAL | Status: DC
  Administered 2016-12-13: 10 mg via ORAL
  Filled 2016-12-12: qty 1

## 2016-12-12 MED ORDER — POLYETHYLENE GLYCOL 3350 17 G PO PACK
17.0000 g | PACK | Freq: Every day | ORAL | Status: DC | PRN
Start: 2016-12-12 — End: 2016-12-13

## 2016-12-12 MED ORDER — NITROGLYCERIN 0.4 MG SL SUBL: 0.4000 mg | SUBLINGUAL_TABLET | SUBLINGUAL | Status: DC | PRN

## 2016-12-12 MED ORDER — ISOSORBIDE MONONITRATE ER 30 MG PO TB24
30.0000 mg | ORAL_TABLET | Freq: Every day | ORAL | Status: DC
  Administered 2016-12-12: 30 mg via ORAL
  Filled 2016-12-12: qty 1

## 2016-12-12 MED ORDER — PANTOPRAZOLE SODIUM 40 MG PO TBEC
40.0000 mg | DELAYED_RELEASE_TABLET | Freq: Every day | ORAL | Status: DC
  Administered 2016-12-13: 40 mg via ORAL
  Filled 2016-12-12: qty 1

## 2016-12-12 MED ORDER — IBUPROFEN 400 MG PO TABS
400.0000 mg | ORAL_TABLET | Freq: Four times a day (QID) | ORAL | Status: DC | PRN
  Administered 2016-12-13: 400 mg via ORAL
  Filled 2016-12-12: qty 1

## 2016-12-12 MED ORDER — SODIUM CHLORIDE 0.9% FLUSH: 3.0000 mL | Freq: Two times a day (BID) | INTRAVENOUS | Status: DC

## 2016-12-12 MED ORDER — ONDANSETRON HCL 4 MG PO TABS: 4.0000 mg | ORAL_TABLET | Freq: Four times a day (QID) | ORAL | Status: DC | PRN

## 2016-12-12 MED ORDER — OXYCODONE HCL 5 MG PO TABS: 5.0000 mg | ORAL_TABLET | Freq: Four times a day (QID) | ORAL | Status: DC | PRN

## 2016-12-12 MED ORDER — NITROGLYCERIN 2 % TD OINT
1.0000 [in_us] | TOPICAL_OINTMENT | Freq: Once | TRANSDERMAL | Status: AC
  Administered 2016-12-12: 1 [in_us] via TOPICAL
  Filled 2016-12-12: qty 1

## 2016-12-12 MED ORDER — ASPIRIN EC 81 MG PO TBEC
81.0000 mg | DELAYED_RELEASE_TABLET | Freq: Every day | ORAL | Status: DC
  Administered 2016-12-13: 81 mg via ORAL
  Filled 2016-12-12: qty 1

## 2016-12-12 MED ORDER — ORPHENADRINE CITRATE ER 100 MG PO TB12
100.0000 mg | ORAL_TABLET | Freq: Two times a day (BID) | ORAL | Status: DC | PRN
  Filled 2016-12-12: qty 1

## 2016-12-12 MED ORDER — ONDANSETRON HCL 4 MG/2ML IJ SOLN: 4.0000 mg | Freq: Four times a day (QID) | INTRAMUSCULAR | Status: DC | PRN

## 2016-12-12 MED ORDER — SODIUM CHLORIDE 0.9% FLUSH
3.0000 mL | Freq: Two times a day (BID) | INTRAVENOUS | Status: DC
  Administered 2016-12-12 – 2016-12-13 (×3): 3 mL via INTRAVENOUS

## 2016-12-12 MED ORDER — ACETAMINOPHEN 325 MG PO TABS
650.0000 mg | ORAL_TABLET | Freq: Four times a day (QID) | ORAL | Status: DC | PRN
  Administered 2016-12-13: 650 mg via ORAL
  Filled 2016-12-12: qty 2

## 2016-12-12 MED ORDER — SODIUM CHLORIDE 0.9% FLUSH: 3.0000 mL | INTRAVENOUS | Status: DC | PRN

## 2016-12-12 MED ORDER — ALBUTEROL SULFATE (2.5 MG/3ML) 0.083% IN NEBU: 2.5000 mg | INHALATION_SOLUTION | RESPIRATORY_TRACT | Status: DC | PRN

## 2016-12-12 NOTE — Consult Note (Signed)
Cardiology Consultation Note  Patient ID: Becky Miller, MRN: [id number], DOB/AGE: September 28, 1965 51 y.o. Admit date: 12/12/2016   Date of Consult: 12/12/2016 Primary Physician: Coral Spikes, DO Primary Cardiologist: [place], [place] Requesting Physician: Dr. Juanna Cao, MD  Chief Complaint: Chest pain Reason for Consult: Same  HPI: 51 y.o. 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   Patient reports one of the adverse effects of her immunotherapy is pericarditis. After patient received second immunotherapy treatment on Tuesday, 08/14/2016 she started having jaw pain for which she took Advil. As that evening progressed she started to develop chest pain and presented to the hospital in [place]. She was found to have ST elevations in the inferior leads and was transferred emergently to [facility]. She was taken immediately to the cardiac catheterization lab after receiving aspirin, [place], and heparin and route. Her cardiac catheter demonstrated normal coronary arteries. Troponin at time of admission 0.01. She was noted to have elevated sedimentation rate as well as CRP at 58 and 33.6, respectively. She underwent CTA PE protocol that showed no evidence of pulmonary embolism but opacities suggestive of possible pneumonia as well as a small pericardial effusion area she was diagnosed with acute  pericarditis and started on therapy with high-dose NSAIDs and culture seen. TTE on 08/15/2016 showed preserved LV EF with normal LV diastolic function, small circumferential pericardial effusion without evidence of tamponade not. At that time she tested negative for influenza as well as other respiratory viruses. It has been felt by her physicians in [place] that her acute pericarditis was likely a side effect of her current immunotherapy for metastatic small cell lung cancer.   Patient had recurrent chest pain on 10/03/16, underwent CTA chest that was negative for pulmonary embolism. No pleural effusion seen, but small pericardial effusion persisted she was started on prednisone and initially felt better though has continued to note some tightness along the middle of her chest radiating up to her left shoulder. This pain would increase with deep inspiration. Patient apparently underwent echocardiogram on 11/08/16 at [facility] that apparently showed no pericardial effusion. Patient was advised to retry prednisone and colchicine secondary to ongoing chest pain.   Patient spends most of her time in [place], [place] and travels back down to [place] area when she is not receiving treatments for her cancer. Patient arrived back in [place] on 3/18. Since that time she has been having intermittent sharp substernal chest pain not associated with exertion. Pain is not related to position or positional movements. No associated symptoms. Pain feels similar to her initial episode of pericarditis back in 07/2016 though pain this time is intermittent whereas in 07/2016 her sharp substernal chest pain was persistent. Patient contacted her cardiology group in [facility] this morning stating she was having intermittent chest pain with more frequency but was also becoming more intense. She was developing chest pain with activities such as making the bed. She was advised to proceed to the local ER.   Upon  patient's arrival to [facility] ED she was  found to have blood pressure of 142/79, heart rate 100 to bpm, temperature 97.22F, oxygen saturation is 90% on room air. Initial labs showed potassium 4.6, serum creatinine 0.64, white blood cell count 8.6, hemoglobin 11.4, platelet count 228, initial troponin mildly elevated at 0.03. Sedimentation rate and CRP not performed in the ED. Chest x-ray showed no acute cardiopulmonary process. EKG showed NSR, 94 bpm, low voltage QRS, nonspecific lateral ST-T changes, no evidence of electrical alternans. She was admitted by internal medicine, order for echo was placed, patient was placed on daily aspirin. Cardiology asked to further evaluate. Currently completely symptom free. Telemetry shows sinus rhythm, 80s bpm. Echocardiogram, pulses paradoxus, sedimentation rate, and CRP all pending at this time.  Past Medical History:  Diagnosis Date  . Environmental and seasonal allergies   . History of chicken pox   . Lung cancer ([place])   . Pericarditis   . Polyp of colon   . Small cell lung cancer ([place])       Most Recent Cardiac Studies: As above    Surgical History:  Past Surgical History:  Procedure Laterality Date  . Lamectomy  2009  . TONSILLECTOMY AND ADENOIDECTOMY  1981     Home Meds: Prior to Admission medications   Medication Sig Start Date End Date Taking? Authorizing Provider  colchicine 0.6 MG tablet Take 0.6 mg by mouth 2 (two) times daily.   Yes Historical Provider, MD  omeprazole (PRILOSEC) 40 MG capsule 40 mg daily.  04/02/16  Yes Historical Provider, MD  predniSONE (DELTASONE) 10 MG tablet Take 10 mg by mouth daily with breakfast.   Yes Historical Provider, MD  LORazepam (ATIVAN) 1 MG tablet Take 1 tablet (1 mg total) by mouth at bedtime. Patient not taking: Reported on 12/12/2016 01/13/16   Rubbie Battiest, NP  ondansetron (ZOFRAN-ODT) 8 MG disintegrating tablet 8 mg every 8 (eight) hours as needed.  04/02/16   Historical Provider, MD  orphenadrine  (NORFLEX) 100 MG tablet Take 100 mg by mouth daily as needed.    Historical Provider, MD  oxyCODONE (OXY IR/ROXICODONE) 5 MG immediate release tablet  03/20/16   Historical Provider, MD  polyethylene glycol powder (GLYCOLAX/MIRALAX) powder  03/19/16   Historical Provider, MD  prochlorperazine (COMPAZINE) 10 MG tablet Take 10 mg by mouth every 6 (six) hours as needed for nausea or vomiting.    Historical Provider, MD  senna (SENOKOT) 8.6 MG TABS tablet Take 2 tablets by mouth.    Historical Provider, MD    Inpatient Medications:  . [START ON 12/13/2016] aspirin EC  81 mg Oral Daily  . enoxaparin (LOVENOX) injection  40 mg Subcutaneous Q24H  . sodium chloride flush  3 mL Intravenous Q12H  . sodium chloride flush  3 mL Intravenous Q12H   . sodium chloride      Allergies: No Known Allergies  Social History   Social History  . Marital status: Married    Spouse name: Briani Maul  . Number of children: N/A  . Years of education: N/A   Occupational History  . Not on file.   Social History Main Topics  . Smoking status: Former Smoker    Quit date: 09/24/2010  . Smokeless tobacco: Never Used  . Alcohol use No  . Drug use: No  . Sexual activity: Not on file   Other Topics Concern  . 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 Colon cancer      Parents  . Hypercholesterolemia      Parents  . Hypertension      Parents  . Kidney disease Brother   . Stroke Brother   . Hyperlipidemia Mother   . Hypertension Mother   . Hyperlipidemia Father   . Hypertension Father      Review of Systems: Review of Systems  Constitutional: Positive for malaise/fatigue and weight loss. Negative for chills, diaphoresis and fever.  HENT: Negative for congestion.     Eyes: Negative for discharge and redness.  Respiratory: Negative for cough, hemoptysis, sputum production, shortness of breath and wheezing.   Cardiovascular: Positive for chest pain. Negative for palpitations, orthopnea, claudication, leg swelling and PND.  Gastrointestinal: Negative for abdominal pain, blood in stool, heartburn, melena, nausea and vomiting.  Genitourinary: Negative for hematuria.  Musculoskeletal: Negative for falls and myalgias.  Skin: Negative for rash.  Neurological: Negative for dizziness, tingling, tremors, sensory change, speech change, focal weakness, loss of consciousness and weakness.  Endo/Heme/Allergies: Does not bruise/bleed easily.  Psychiatric/Behavioral: Negative for substance abuse. The patient is not nervous/anxious.   All other systems reviewed and are negative.   Labs:  Recent Labs  12/12/16 1028  TROPONINI 0.03*   Lab Results  Component Value Date   WBC 8.6 12/12/2016   HGB 11.4 (L) 12/12/2016   HCT 35.0 12/12/2016   MCV 79.9 (L) 12/12/2016   PLT 328 12/12/2016     Recent Labs Lab 12/12/16 1028  NA 139  K 4.6  CL 105  CO2 26  BUN 17  CREATININE 0.64  CALCIUM 9.5  GLUCOSE 111*   Lab Results  Component Value Date   CHOL 263 (A) 01/14/2016   HDL 44 01/14/2016   LDLCALC 200 01/14/2016   TRIG 96 01/14/2016   No results found for: DDIMER  Radiology/Studies:  Dg Chest Port 1 View  Result Date: 12/12/2016 CLINICAL DATA:  Chest pain.  History of previous lung carcinoma EXAM: PORTABLE CHEST 1 VIEW COMPARISON:  None. FINDINGS: Port-A-Cath tip is in the superior vena cava. No pneumothorax. No edema or consolidation. There is apparent left perihilar region scarring. Heart is upper normal in size with pulmonary vascularity within normal limits. No adenopathy. There is postoperative change in the lower cervical region. IMPRESSION: Port-A-Cath tip in superior vena cava. No pneumothorax. Left perihilar region scarring. No edema or  consolidation. Electronically Signed   By: Lowella Grip III M.D.   On: 12/12/2016 11:23    EKG: Interpreted by me showed: NSR, 94 bpm, diffuse PR depression, nonspecific ST-T changes, no evidence of electrical alternans Telemetry: Interpreted by me showed: NSR, 70's bpm  Weights: Filed Weights   12/12/16 1004  Weight: 160 lb (72.6 kg)     Physical Exam: Blood pressure 124/86, pulse 85, temperature 97.8 F (36.6 C), temperature source Oral, resp. rate (!) 21, height '5\' 4"'$  (1.626 m), weight 160 lb (72.6 kg), last menstrual period 09/25/2011, SpO2 98 %. Body mass index is 27.46 kg/m. General: Well developed, well nourished, in no acute distress. Head: Normocephalic, atraumatic, sclera non-icteric, no xanthomas, nares are without discharge.  Neck: Negative for carotid bruits. JVD not elevated. Lungs: Clear bilaterally to auscultation without wheezes, rales, or rhonchi. Breathing is  unlabored. Heart: RRR with S1 S2. No murmurs, rubs, or gallops appreciated. Abdomen: Soft, non-tender, non-distended with normoactive bowel sounds. No hepatomegaly. No rebound/guarding. No obvious abdominal masses. Msk:  Strength and tone appear normal for age. Extremities: No clubbing or cyanosis. No edema. Distal pedal pulses are 2+ and equal bilaterally. Neuro: Alert and oriented X 3. No facial asymmetry. No focal deficit. Moves all extremities spontaneously. Psych:  Responds to questions appropriately with a normal affect.    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 Atypical chest pain/possible pericarditis/possible myocarditis: -Currently chest pain-free -Continue to cycle troponin, no indication for heparin drip at this time, if troponin trends significantly upwards may need heparin drip at that time  -Check echocardiogram to evaluate for pericardial effusion and or evidence of  hemodynamic compromise -Continue prednisone with colchicine -Check sedimentation rate and CRP compared to initial values obtained in [place], [place] per history of present illness -Cannot definitively say this is isolated to the pericardium given patient's metastatic small cell lung cancer with metastasis along the lungs, thoracic and lumbar spine, tibial tuberosity, and CSF -No indication for emergent pericardiocentesis or transfer to outside facility for pericardial window/pericardial stripping  2. Metastatic small cell carcinoma: -Possibly playing a role in the above -Patient's immunotherapy has adverse effect of myocarditis. When patient had placed her immunotherapy on hold for 12 weeks while undergoing treatment for acute pericarditis she was asymptomatic. Upon resuming her immunotherapy, she has subsequently redeveloped symptoms possibly consistent with pericarditis -Defer further management of patient's immunotherapy to her oncology group in [place], [place], Marcille Blanco [place] Pager: [phone] 12/12/2016, 2:05 PM

## 2016-12-12 NOTE — ED Provider Notes (Signed)
[facility] Emergency Department Provider Note        Time seen: ----------------------------------------- 10:05 AM on 12/12/2016 -----------------------------------------    I have reviewed the triage vital signs and the nursing notes.   HISTORY  Chief Complaint Chest Pain    HPI Becky Miller is a 51 y.o. female who presents to the ER for intermittent chest pain for last 2 weeks. Patient states the pain is worse over the last 24 hours. She reports she is currently getting chemotherapy for lung cancer and the treatment cause pericarditis in November.Reportedly she had an echocardiogram 2 weeks ago that was normal and did not show any pericardial fluid. She does describe pain that radiates from the Center for chest and both arms and into her jaw. Pain lasts less than 5 minutes. She did have an episode where awoke her from sleep.   Past Medical History:  Diagnosis Date  . Environmental and seasonal allergies   . History of chicken pox   . Lung cancer ([place])   . Polyp of colon     Patient Active Problem List   Diagnosis Date Noted  . Annual physical exam 04/11/2016  . Small cell lung cancer ([place]) 01/13/2016  . Environmental allergies 01/13/2016  . S/P radiation therapy 01/13/2016  . Sleep disturbance 01/13/2016    Past Surgical History:  Procedure Laterality Date  . Lamectomy  2009  . TONSILLECTOMY AND ADENOIDECTOMY  1981    Allergies Patient has no known allergies.  Social History Social History  Substance Use Topics  . Smoking status: Former Smoker    Quit date: 09/24/2010  . Smokeless tobacco: Never Used  . Alcohol use No    Review of Systems Constitutional: Negative for fever. Cardiovascular: Positive for chest pain Respiratory: Negative for shortness of breath. Gastrointestinal: Negative for abdominal pain, vomiting and diarrhea. Genitourinary: Negative for dysuria. Musculoskeletal: Negative for back pain. Skin: Negative for  rash. Neurological: Negative for headaches, focal weakness or numbness.  10-point ROS otherwise negative.  ____________________________________________   PHYSICAL EXAM:  VITAL SIGNS: ED Triage Vitals [12/12/16 1004]  Enc Vitals Group     BP      Pulse      Resp      Temp      Temp src      SpO2      Weight 160 lb (72.6 kg)     Height '5\' 4"'$  (1.626 m)     Head Circumference      Peak Flow      Pain Score 0     Pain Loc      Pain Edu?      Excl. in [place]?     Constitutional: Alert and oriented. Well appearing and in no distress. Eyes: Conjunctivae are normal. PERRL. Normal extraocular movements. ENT   Head: Normocephalic and atraumatic.   Nose: No congestion/rhinnorhea.   Mouth/Throat: Mucous membranes are moist.   Neck: No stridor. Cardiovascular: Normal rate, regular rhythm. No murmurs, rubs, or gallops. Respiratory: Normal respiratory effort without tachypnea nor retractions. Breath sounds are clear and equal bilaterally. No wheezes/rales/rhonchi. Gastrointestinal: Soft and nontender. Normal bowel sounds Musculoskeletal: Nontender with normal range of motion in all extremities. No lower extremity tenderness nor edema. Neurologic:  Normal speech and language. No gross focal neurologic deficits are appreciated.  Skin:  Skin is warm, dry and intact. No rash noted. Psychiatric: Mood and affect are normal. Speech and behavior are normal.  ____________________________________________  EKG: Interpreted by me. Sinus rhythm rate  94 bpm, normal.  We'll, normal QRS, normal QT, normal axis.  ____________________________________________  ED COURSE:  Pertinent labs & imaging results that were available during my care of the patient were reviewed by me and considered in my medical decision making (see chart for details). Patient presents to ER for nonspecific chest pain. We will assess with labs and imaging.   Procedures ____________________________________________    LABS (pertinent positives/negatives)  Labs Reviewed  BASIC METABOLIC PANEL - Abnormal; Notable for the following:       Result Value   Glucose, Bld 111 (*)    All other components within normal limits  CBC - Abnormal; Notable for the following:    Hemoglobin 11.4 (*)    MCV 79.9 (*)    RDW 20.6 (*)    All other components within normal limits  TROPONIN I - Abnormal; Notable for the following:    Troponin I 0.03 (*)    All other components within normal limits    RADIOLOGY Images were viewed by me  Chest x-ray IMPRESSION: Port-A-Cath tip in superior vena cava. No pneumothorax. Left perihilar region scarring. No edema or consolidation. ____________________________________________  FINAL ASSESSMENT AND PLAN  Chest pain  Plan: Patient with labs and imaging as dictated above. Patient with chest pain of uncertain etiology. Troponin is elevated slightly at 0.03. She is high risk according to heart score. I will discuss with the hospitalist for admission.   Earleen Newport, MD   Note: This note was generated in part or whole with voice recognition software. Voice recognition is usually quite accurate but there are transcription errors that can and very often do occur. I apologize for any typographical errors that were not detected and corrected.     Earleen Newport, MD 12/12/16 [phone]

## 2016-12-12 NOTE — ED Triage Notes (Signed)
Pt c/o intermittent chest pain for the past 2 weeks, worse in the past day. Pt states she is currently getting chemo for lung CA and the treatment caused pericarditis in November and started back just a few weeks ago and is having similar sx.

## 2016-12-12 NOTE — ED Notes (Signed)
ED Provider at bedside. 

## 2016-12-12 NOTE — ED Notes (Signed)
Patient assisted to restroom.  Ambulation was WNL and patient needed no assist.

## 2016-12-12 NOTE — H&P (Signed)
[facility] NAME: Becky Miller    MR#:  [id number]  DATE OF BIRTH:  07-18-1966  DATE OF ADMISSION:  12/12/2016  PRIMARY CARE PHYSICIAN: Coral Spikes, DO   REQUESTING/REFERRING PHYSICIAN: Dr. Jimmye Norman  CHIEF COMPLAINT:   Chief Complaint  Patient presents with  . Chest Pain    HISTORY OF PRESENT ILLNESS:  Becky Miller  is a 51 y.o. female with a known history of Small cell lung cancer with metastasis, GERD and recent diagnosis of pericarditis and normal cardiac catheterization in November 2017 presents to the emergency room due to 10 days of on and off midsternal chest pain. Patient initially noticed the pain March 11 and started slowly. Since yesterday with minimal ambulation or activities of daily living she has midsternal pain lasting about 5 minutes and resolves on its own. No shortness of breath. Mild nausea. No sweating. Pain radiates to the back, jaw and arms. Troponin normal. EKG shows nothing acute. Chest x-ray shows some hilar scarring.  PAST MEDICAL HISTORY:   Past Medical History:  Diagnosis Date  . Environmental and seasonal allergies   . History of chicken pox   . Lung cancer ([place])   . Pericarditis   . Polyp of colon   . Small cell lung cancer ([place])     PAST SURGICAL HISTORY:   Past Surgical History:  Procedure Laterality Date  . Lamectomy  2009  . TONSILLECTOMY AND ADENOIDECTOMY  1981    SOCIAL HISTORY:   Social History  Substance Use Topics  . Smoking status: Former Smoker    Quit date: 09/24/2010  . Smokeless tobacco: Never Used  . Alcohol use No    FAMILY HISTORY:   Family History  Problem Relation Age of Onset  . Colon cancer      Parents  . Hypercholesterolemia      Parents  . Hypertension      Parents  . Kidney disease Brother   . Stroke Brother   . Hyperlipidemia Mother   . Hypertension Mother   . Hyperlipidemia Father   . Hypertension Father     DRUG ALLERGIES:  No Known  Allergies  REVIEW OF SYSTEMS:   Review of Systems  Constitutional: Negative for chills and fever.  HENT: Negative for sore throat.   Eyes: Negative for blurred vision, double vision and pain.  Respiratory: Negative for cough, hemoptysis, shortness of breath and wheezing.   Cardiovascular: Positive for chest pain. Negative for palpitations, orthopnea and leg swelling.  Gastrointestinal: Negative for abdominal pain, constipation, diarrhea, heartburn, nausea and vomiting.  Genitourinary: Negative for dysuria and hematuria.  Musculoskeletal: Negative for back pain and joint pain.  Skin: Negative for rash.  Neurological: Negative for sensory change, speech change, focal weakness and headaches.  Endo/Heme/Allergies: Does not bruise/bleed easily.  Psychiatric/Behavioral: Negative for depression. The patient is not nervous/anxious.     MEDICATIONS AT HOME:   Prior to Admission medications   Medication Sig Start Date End Date Taking? Authorizing Provider  colchicine 0.6 MG tablet Take 0.6 mg by mouth 2 (two) times daily.   Yes Historical Provider, MD  omeprazole (PRILOSEC) 40 MG capsule 40 mg daily.  04/02/16  Yes Historical Provider, MD  predniSONE (DELTASONE) 10 MG tablet Take 10 mg by mouth daily with breakfast.   Yes Historical Provider, MD  LORazepam (ATIVAN) 1 MG tablet Take 1 tablet (1 mg total) by mouth at bedtime. Patient not taking: Reported on 12/12/2016 01/13/16   Morey Hummingbird  Cheryll Dessert, NP  ondansetron (ZOFRAN-ODT) 8 MG disintegrating tablet 8 mg every 8 (eight) hours as needed.  04/02/16   Historical Provider, MD  orphenadrine (NORFLEX) 100 MG tablet Take 100 mg by mouth daily as needed.    Historical Provider, MD  oxyCODONE (OXY IR/ROXICODONE) 5 MG immediate release tablet  03/20/16   Historical Provider, MD  polyethylene glycol powder (GLYCOLAX/MIRALAX) powder  03/19/16   Historical Provider, MD  prochlorperazine (COMPAZINE) 10 MG tablet Take 10 mg by mouth every 6 (six) hours as needed for  nausea or vomiting.    Historical Provider, MD  senna (SENOKOT) 8.6 MG TABS tablet Take 2 tablets by mouth.    Historical Provider, MD     VITAL SIGNS:  Blood pressure (!) 139/94, pulse 87, temperature 97.8 F (36.6 C), temperature source Oral, resp. rate 20, height '5\' 4"'$  (1.626 m), weight 72.6 kg (160 lb), last menstrual period 09/25/2011, SpO2 99 %.  PHYSICAL EXAMINATION:  Physical Exam  GENERAL:  51 y.o.-year-old patient lying in the bed with no acute distress.  EYES: Pupils equal, round, reactive to light and accommodation. No scleral icterus. Extraocular muscles intact.  HEENT: Head atraumatic, normocephalic. Oropharynx and nasopharynx clear. No oropharyngeal erythema, moist oral mucosa  NECK:  Supple, no jugular venous distention. No thyroid enlargement, no tenderness.  LUNGS: Normal breath sounds bilaterally, no wheezing, rales, rhonchi. No use of accessory muscles of respiration.  CARDIOVASCULAR: S1, S2 normal. No murmurs, rubs, or gallops.  ABDOMEN: Soft, nontender, nondistended. Bowel sounds present. No organomegaly or mass.  EXTREMITIES: No pedal edema, cyanosis, or clubbing. + 2 pedal & radial pulses b/l.   NEUROLOGIC: Cranial nerves II through XII are intact. No focal Motor or sensory deficits appreciated b/l PSYCHIATRIC: The patient is alert and oriented x 3. Good affect.  SKIN: No obvious rash, lesion, or ulcer.   LABORATORY PANEL:   CBC  Recent Labs Lab 12/12/16 1028  WBC 8.6  HGB 11.4*  HCT 35.0  PLT 328   ------------------------------------------------------------------------------------------------------------------  Chemistries   Recent Labs Lab 12/12/16 1028  NA 139  K 4.6  CL 105  CO2 26  GLUCOSE 111*  BUN 17  CREATININE 0.64  CALCIUM 9.5   ------------------------------------------------------------------------------------------------------------------  Cardiac Enzymes  Recent Labs Lab 12/12/16 1028  TROPONINI 0.03*    ------------------------------------------------------------------------------------------------------------------  RADIOLOGY:  Dg Chest Port 1 View  Result Date: 12/12/2016 CLINICAL DATA:  Chest pain.  History of previous lung carcinoma EXAM: PORTABLE CHEST 1 VIEW COMPARISON:  None. FINDINGS: Port-A-Cath tip is in the superior vena cava. No pneumothorax. No edema or consolidation. There is apparent left perihilar region scarring. Heart is upper normal in size with pulmonary vascularity within normal limits. No adenopathy. There is postoperative change in the lower cervical region. IMPRESSION: Port-A-Cath tip in superior vena cava. No pneumothorax. Left perihilar region scarring. No edema or consolidation. Electronically Signed   By: Lowella Grip III M.D.   On: 12/12/2016 11:23     IMPRESSION AND PLAN:   * Chest pain with typical features As per patient she had normal catheterization in November 2017 which is reassuring. A troponin is normal. EKG shows nothing acute. With recent diagnosis of pericarditis could be a reoccurrence or possibly myocarditis. Repeat troponin. Admit to telemetry unit. Check echocardiogram. Consult cardiology. Aspirin daily.  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  * DVT prophylaxis with Lovenox  All the records are reviewed and case discussed with ED provider.  Management plans discussed with the patient, family and they are in agreement.  CODE STATUS: FULL CODE  TOTAL TIME TAKING CARE OF THIS PATIENT: 40 minutes.   Hillary Bow R M.D on 12/12/2016 at 12:25 PM  Between 7am to 6pm - Pager - [phone]  After 6pm go to [email or web address] - password [email or web address] [facility]  Office  [phone]  CC: Primary care physician; Coral Spikes, DO  Note: This dictation was prepared with Dragon dictation along with smaller phrase technology. Any transcriptional errors that result  from this process are unintentional.

## 2016-12-12 NOTE — ED Notes (Signed)
MD has given ok for pt to take home morning medications per pt request.  Pt taking them at this time.

## 2016-12-13 ENCOUNTER — Telehealth: Payer: Self-pay | Admitting: Family Medicine

## 2016-12-13 DIAGNOSIS — R0789 Other chest pain: Secondary | ICD-10-CM | POA: Diagnosis not present

## 2016-12-13 LAB — ECHOCARDIOGRAM COMPLETE
HEIGHTINCHES: 64 in
Weight: 2560 oz

## 2016-12-13 MED ORDER — DILTIAZEM HCL ER COATED BEADS 120 MG PO CP24
120.0000 mg | ORAL_CAPSULE | Freq: Every day | ORAL | Status: DC
  Administered 2016-12-13: 120 mg via ORAL
  Filled 2016-12-13: qty 1

## 2016-12-13 MED ORDER — NITROGLYCERIN 0.4 MG SL SUBL: 0.4000 mg | SUBLINGUAL_TABLET | SUBLINGUAL | 0 refills | Status: AC | PRN

## 2016-12-13 MED ORDER — DILTIAZEM HCL ER COATED BEADS 120 MG PO CP24
120.0000 mg | ORAL_CAPSULE | Freq: Every day | ORAL | 0 refills | Status: AC
Start: 2016-12-14 — End: ?

## 2016-12-13 MED ORDER — ASPIRIN 81 MG PO TBEC: 81.0000 mg | DELAYED_RELEASE_TABLET | Freq: Every day | ORAL | 0 refills | Status: AC

## 2016-12-13 NOTE — Telephone Encounter (Signed)
[facility] called stating that pt is being discharged today for chest pain scheduled pt for 12/19/16 '@11'$ :30

## 2016-12-13 NOTE — Discharge Summary (Signed)
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 Environmental and seasonal allergies   . History of chicken pox   . Lung cancer ([place])   . Pericarditis   . Polyp of colon   . Small cell lung cancer [facility])     HOSPITAL COURSE:   51 y.o. 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    1. Atypical chest pain: Patient's symptoms are atypical for ACS. Her initial troponin was 0.03. Subsequent troponins were negative. She underwent echocardiogram which showed diastolic dysfunction and normal ejection fraction. She was evaluated by cardiology. She had no evidence of pericarditis or myocarditis.  2. Essential hypertension: It is recommended by cardiology to try Cardizem CD. Isosorbide gave the  patient a headache  3. Metastatic small cell carcinoma: Patient will follow up with oncology as outpatient   DISCHARGE CONDITIONS AND DIET:   Stable Cardiac diet  CONSULTS OBTAINED:  Treatment Team:  Minna Merritts, MD  DRUG ALLERGIES:  No Known Allergies  DISCHARGE MEDICATIONS:   Discharge Medication List as of 12/13/2016 12:23 PM    START taking these medications   Details  aspirin EC 81 MG EC tablet Take 1 tablet (81 mg total) by mouth daily., Starting Fri 12/14/2016, Normal    diltiazem (CARDIZEM CD) 120 MG 24 hr capsule Take 1 capsule (120 mg total) by mouth daily., Starting Fri 12/14/2016, Normal    nitroGLYCERIN (NITROSTAT) 0.4 MG SL tablet Place 1 tablet (0.4 mg total) under the tongue every 5 (five) minutes as needed for chest pain., Starting Thu 12/13/2016, Normal      CONTINUE these medications which have NOT CHANGED   Details  colchicine 0.6 MG tablet Take 0.6 mg by mouth 2 (two) times daily., Historical Med    omeprazole (PRILOSEC) 40 MG capsule 40 mg daily. , Starting Mon 04/02/2016, Historical Med    predniSONE (DELTASONE) 10 MG tablet Take 10 mg by mouth daily with breakfast., Historical Med    LORazepam (ATIVAN) 1 MG tablet Take 1 tablet (1 mg total) by mouth at bedtime., Starting Fri 01/13/2016, Print    ondansetron (ZOFRAN-ODT) 8 MG disintegrating tablet 8 mg every 8 (eight) hours as needed. , Starting Mon 04/02/2016, Historical Med    orphenadrine (NORFLEX) 100 MG tablet Take 100 mg by mouth daily as needed., Historical Med    oxyCODONE (OXY IR/ROXICODONE) 5 MG immediate release tablet Starting  Tue 03/20/2016, Historical Med    polyethylene glycol powder (GLYCOLAX/MIRALAX) powder Starting Mon 03/19/2016, Historical Med    prochlorperazine (COMPAZINE) 10 MG tablet Take 10 mg by mouth every 6 (six) hours as needed for nausea or vomiting., Historical Med    senna (SENOKOT) 8.6 MG TABS tablet Take 2 tablets by mouth., Historical Med          Today    CHIEF COMPLAINT:  Doing well ready for discharge home   VITAL SIGNS:  Blood pressure 106/64, pulse 87, temperature 98.4 F (36.9 C), temperature source Oral, resp. rate 18, height '5\' 4"'$  (1.626 m), weight 73.9 kg (163 lb), last menstrual period 09/25/2011, SpO2 97 %.   REVIEW OF SYSTEMS:  Review of Systems  Constitutional: Negative.  Negative for chills, fever and malaise/fatigue.  HENT: Negative.  Negative for ear discharge, ear pain, hearing loss, nosebleeds and sore throat.   Eyes: Negative.  Negative for blurred vision and pain.  Respiratory: Negative.  Negative for cough, hemoptysis, shortness of breath and wheezing.   Cardiovascular: Negative.  Negative for chest pain, palpitations and leg swelling.  Gastrointestinal: Negative.  Negative for abdominal pain, blood in stool, diarrhea, nausea and vomiting.  Genitourinary: Negative.  Negative for dysuria.  Musculoskeletal: Negative.  Negative for back pain.  Skin: Negative.   Neurological: Negative for dizziness, tremors, speech change, focal weakness, seizures and headaches.  Endo/Heme/Allergies: Negative.  Does not bruise/bleed easily.  Psychiatric/Behavioral: Negative.  Negative for depression, hallucinations and suicidal ideas.     PHYSICAL EXAMINATION:  GENERAL:  51 y.o.-year-old patient lying in the bed with no acute distress.  NECK:  Supple, no jugular venous distention. No thyroid enlargement, no tenderness.  LUNGS: Normal breath sounds bilaterally, no wheezing, rales,rhonchi  No use of accessory muscles of respiration.  CARDIOVASCULAR: S1, S2 normal. No murmurs, rubs, or gallops.  ABDOMEN: Soft, non-tender, non-distended. Bowel sounds present. No organomegaly or mass.  EXTREMITIES: No pedal edema, cyanosis, or clubbing.  PSYCHIATRIC: The patient is alert and oriented x 3.  SKIN: No obvious rash, lesion, or ulcer. She has port  DATA REVIEW:   CBC  Recent Labs Lab 12/12/16 1028  WBC 8.6  HGB 11.4*  HCT 35.0   PLT 328    Chemistries   Recent Labs Lab 12/12/16 1028 12/12/16 1636  NA 139  --   K 4.6  --   CL 105  --   CO2 26  --   GLUCOSE 111*  --   BUN 17  --   CREATININE 0.64  --   CALCIUM 9.5  --   MG  --  1.8    Cardiac Enzymes  Recent Labs Lab 12/12/16 1028 12/12/16 1636 12/12/16 2153  TROPONINI 0.03* <0.03 <0.03    Microbiology Results  '@[id number]'$ @  RADIOLOGY:  Dg Chest Port 1 View  Result Date: 12/12/2016 CLINICAL DATA:  Chest pain.  History of previous lung carcinoma EXAM: PORTABLE CHEST 1 VIEW COMPARISON:  None. FINDINGS: Port-A-Cath tip is in the superior vena cava. No pneumothorax. No edema or consolidation. There is apparent left perihilar region scarring. Heart is upper normal in size with pulmonary vascularity within normal limits. No adenopathy. There is postoperative change in the lower cervical region. IMPRESSION: Port-A-Cath tip in superior vena cava. No pneumothorax. Left perihilar region scarring. No edema or consolidation. Electronically Signed   By: Lowella Grip III M.D.   On: 12/12/2016 11:23      Discharge Medication List as of 12/13/2016 12:23 PM  START taking these medications   Details  aspirin EC 81 MG EC tablet Take 1 tablet (81 mg total) by mouth daily., Starting Fri 12/14/2016, Normal    diltiazem (CARDIZEM CD) 120 MG 24 hr capsule Take 1 capsule (120 mg total) by mouth daily., Starting Fri 12/14/2016, Normal    nitroGLYCERIN (NITROSTAT) 0.4 MG SL tablet Place 1 tablet (0.4 mg total) under the tongue every 5 (five) minutes as needed for chest pain., Starting Thu 12/13/2016, Normal      CONTINUE these medications which have NOT CHANGED   Details  colchicine 0.6 MG tablet Take 0.6 mg by mouth 2 (two) times daily., Historical Med    omeprazole (PRILOSEC) 40 MG capsule 40 mg daily. , Starting Mon 04/02/2016, Historical Med    predniSONE (DELTASONE) 10 MG tablet Take 10 mg by mouth daily with breakfast., Historical Med    LORazepam  (ATIVAN) 1 MG tablet Take 1 tablet (1 mg total) by mouth at bedtime., Starting Fri 01/13/2016, Print    ondansetron (ZOFRAN-ODT) 8 MG disintegrating tablet 8 mg every 8 (eight) hours as needed. , Starting Mon 04/02/2016, Historical Med    orphenadrine (NORFLEX) 100 MG tablet Take 100 mg by mouth daily as needed., Historical Med    oxyCODONE (OXY IR/ROXICODONE) 5 MG immediate release tablet Starting Tue 03/20/2016, Historical Med    polyethylene glycol powder (GLYCOLAX/MIRALAX) powder Starting Mon 03/19/2016, Historical Med    prochlorperazine (COMPAZINE) 10 MG tablet Take 10 mg by mouth every 6 (six) hours as needed for nausea or vomiting., Historical Med    senna (SENOKOT) 8.6 MG TABS tablet Take 2 tablets by mouth., Historical Med         Management plans discussed with the patient and she is in agreement. Stable for discharge home  Patient should follow up with pcp  CODE STATUS:     Code Status Orders        Start     Ordered   12/12/16 1224  Full code  Continuous     12/12/16 1225    Code Status History    Date Active Date Inactive Code Status Order ID Comments User Context   This patient has a current code status but no historical code status.    Advance Directive Documentation     Most Recent Value  Type of Advance Directive  Healthcare Power of Attorney, Living will  Pre-existing out of facility DNR order (yellow form or pink MOST form)  -  "MOST" Form in Place?  -      TOTAL TIME TAKING CARE OF THIS PATIENT: 37 minutes.    Note: This dictation was prepared with Dragon dictation along with smaller phrase technology. Any transcriptional errors that result from this process are unintentional.  Cortlandt Capuano M.D on 12/13/2016 at 1:20 PM  Between 7am to 6pm - Pager - [phone] After 6pm go to [email or web address] - password [email or web address] [facility]  Office  [phone]  CC: Primary care physician; Coral Spikes, DO

## 2016-12-13 NOTE — Progress Notes (Signed)
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 Echo pending. Started on Imdur in the evening of 3/21 leading to drop in systolic blood pressure into the 90s as well as dull headache. Prefers to stop this medicine and try something else. Sedimentation rate and CRP unrevealing.  Inpatient Medications    Scheduled Meds: . aspirin EC  81 mg Oral Daily  . colchicine  0.6 mg Oral BID  . diltiazem  120 mg Oral Daily  . enoxaparin (LOVENOX) injection  40 mg Subcutaneous Q24H  . pantoprazole  40 mg Oral Daily  . predniSONE  10 mg Oral Q breakfast  . sodium chloride flush  3 mL Intravenous Q12H  . sodium chloride flush  3 mL Intravenous Q12H   Continuous Infusions:  PRN Meds: sodium chloride, acetaminophen **OR** acetaminophen, albuterol, ibuprofen, nitroGLYCERIN, ondansetron **OR** ondansetron (ZOFRAN) IV, orphenadrine, oxyCODONE, polyethylene glycol, sodium chloride flush   Vital Signs    Vitals:   12/12/16 1730 12/12/16 1806 12/12/16 1933 12/13/16 0616  BP:  133/78 119/81 99/63  Pulse: 84 86 86 77  Resp: '15 16  16  '$ Temp:  97.9 F (36.6 C) 97.7 F (36.5 C) 97.9 F (36.6 C)  TempSrc:  Oral Oral Oral  SpO2: 97% 99% 97% 96%  Weight:    163 lb (73.9 kg)  Height:       No intake or output data in the 24 hours ending 12/13/16 0826 Filed Weights   12/12/16 1004 12/13/16 0616  Weight: 160 lb (72.6 kg) 163 lb (73.9 kg)    Physical Exam    GEN: Well nourished, well developed, in no acute distress.  HEENT: Grossly normal.  Neck: Supple, no JVD, carotid bruits, or masses. Cardiac: RRR, no  murmurs, rubs, or gallops. No clubbing, cyanosis, edema.  Radials/DP/PT 2+ and equal bilaterally.  Respiratory:  Respirations regular and unlabored, clear to auscultation bilaterally. GI: Soft, nontender, nondistended, BS + x 4. MS: no deformity or atrophy. Skin: warm and dry, no rash. Neuro:  Strength and sensation are intact. Psych: AAOx3.  Normal affect.  Labs    CBC  Recent Labs  12/12/16 1028  WBC 8.6  HGB 11.4*  HCT 35.0  MCV 79.9*  PLT 109   Basic Metabolic Panel  Recent Labs  12/12/16 1028 12/12/16 1636  NA 139  --   K 4.6  --   CL 105  --   CO2 26  --   GLUCOSE 111*  --   BUN 17  --   CREATININE 0.64  --   CALCIUM 9.5  --   MG  --  1.8   Liver Function Tests No results for input(s): AST, ALT, ALKPHOS, BILITOT, PROT, ALBUMIN in the last 72 hours. No results for input(s): LIPASE, AMYLASE in the last 72 hours. Cardiac Enzymes  Recent Labs  12/12/16 1028 12/12/16 1636 12/12/16 2153  TROPONINI 0.03* <0.03 <0.03   BNP Invalid input(s): POCBNP D-Dimer No results for input(s): DDIMER in the last 72 hours. Hemoglobin A1C No results for input(s): HGBA1C in the last 72 hours. Fasting Lipid Panel No  results for input(s): CHOL, HDL, LDLCALC, TRIG, CHOLHDL, LDLDIRECT in the last 72 hours. Thyroid Function Tests  Recent Labs  12/12/16 1636  TSH 1.350    Telemetry    Currently sinus rhythm, 70s bpm, overnight episodes of sinus tach into the 120s to 140s bpm - Personally Reviewed  ECG    n/a - Personally Reviewed  Radiology    Dg Chest Port 1 View  Result Date: 12/12/2016 CLINICAL DATA:  Chest pain.  History of previous lung carcinoma EXAM: PORTABLE CHEST 1 VIEW COMPARISON:  None. FINDINGS: Port-A-Cath tip is in the superior vena cava. No pneumothorax. No edema or consolidation. There is apparent left perihilar region scarring. Heart is upper normal in size with pulmonary vascularity within normal limits. No adenopathy. There is postoperative  change in the lower cervical region. IMPRESSION: Port-A-Cath tip in superior vena cava. No pneumothorax. Left perihilar region scarring. No edema or consolidation. Electronically Signed   By: Lowella Grip III M.D.   On: 12/12/2016 11:23    Cardiac Studies   TTE pending  Patient Profile     51 y.o. 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   Assessment & Plan    1. Atypical chest pain/possible pericarditis/possible myocarditis: -Currently chest pain-free/Has been chest pain-free since her arrival to the ER -Initial troponin mildly elevated at 0.03, subsequent troponins negative at less than 0.032 -Echocardiogram pending to evaluate for pericardial effusion -M.D. overnight felt less likely pericarditis, possible coronary vasospasm, started on Imdur -Imdur lead to headache as well as drop in systolic blood pressure into the 90s, patient request to discontinue this medication -Trial of long-acting Cardizem CD if blood pressure remains greater than 836 systolic -Continue prednisone with colchicine -Sedimentation rate and CRP unrevealing -Cannot definitively say this is isolated to the pericardium given patient's metastatic small cell lung cancer with metastasis along the lungs, thoracic and lumbar spine, tibial tuberosity, and CSF versus coronary vasospasm -No indication for emergent pericardiocentesis or transfer to  outside facility for pericardial window/pericardial stripping  2. Metastatic small cell carcinoma: -Possibly playing a role in the above -Patient's immunotherapy has adverse effect of myocarditis. When patient had placed her immunotherapy on hold for 12 weeks while undergoing treatment for acute pericarditis she was asymptomatic. Upon resuming her immunotherapy, she has subsequently redeveloped symptoms possibly consistent with pericarditis -Defer further management of patient's immunotherapy to her oncology group in [place], [place], [place] Pager: [phone] 12/13/2016, 8:26 AM

## 2016-12-13 NOTE — Progress Notes (Signed)
Patient is discharge home in a stable condition, denies pain at time of discharge, summary and f/u care given to both pt and husband , verbalized understanding , left with husband

## 2016-12-13 NOTE — Care Management (Signed)
Placed in observation for chest pain.  Troponins are negative and cardiology has consulted.  No discharge needs identified

## 2016-12-14 NOTE — Telephone Encounter (Signed)
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 Patient will follow up with second PCP there.

## 2016-12-19 ENCOUNTER — Ambulatory Visit: Payer: 59 | Admitting: Family Medicine

## 2018-03-24 DEATH — deceased

## 2018-05-14 IMAGING — DX DG CHEST 1V PORT
1 series · 1 of 1 positions shown · non-contrast
Comparison: None.

CLINICAL DATA: Chest pain.  History of previous lung carcinoma

EXAM:
PORTABLE CHEST 1 VIEW

[chest ap]
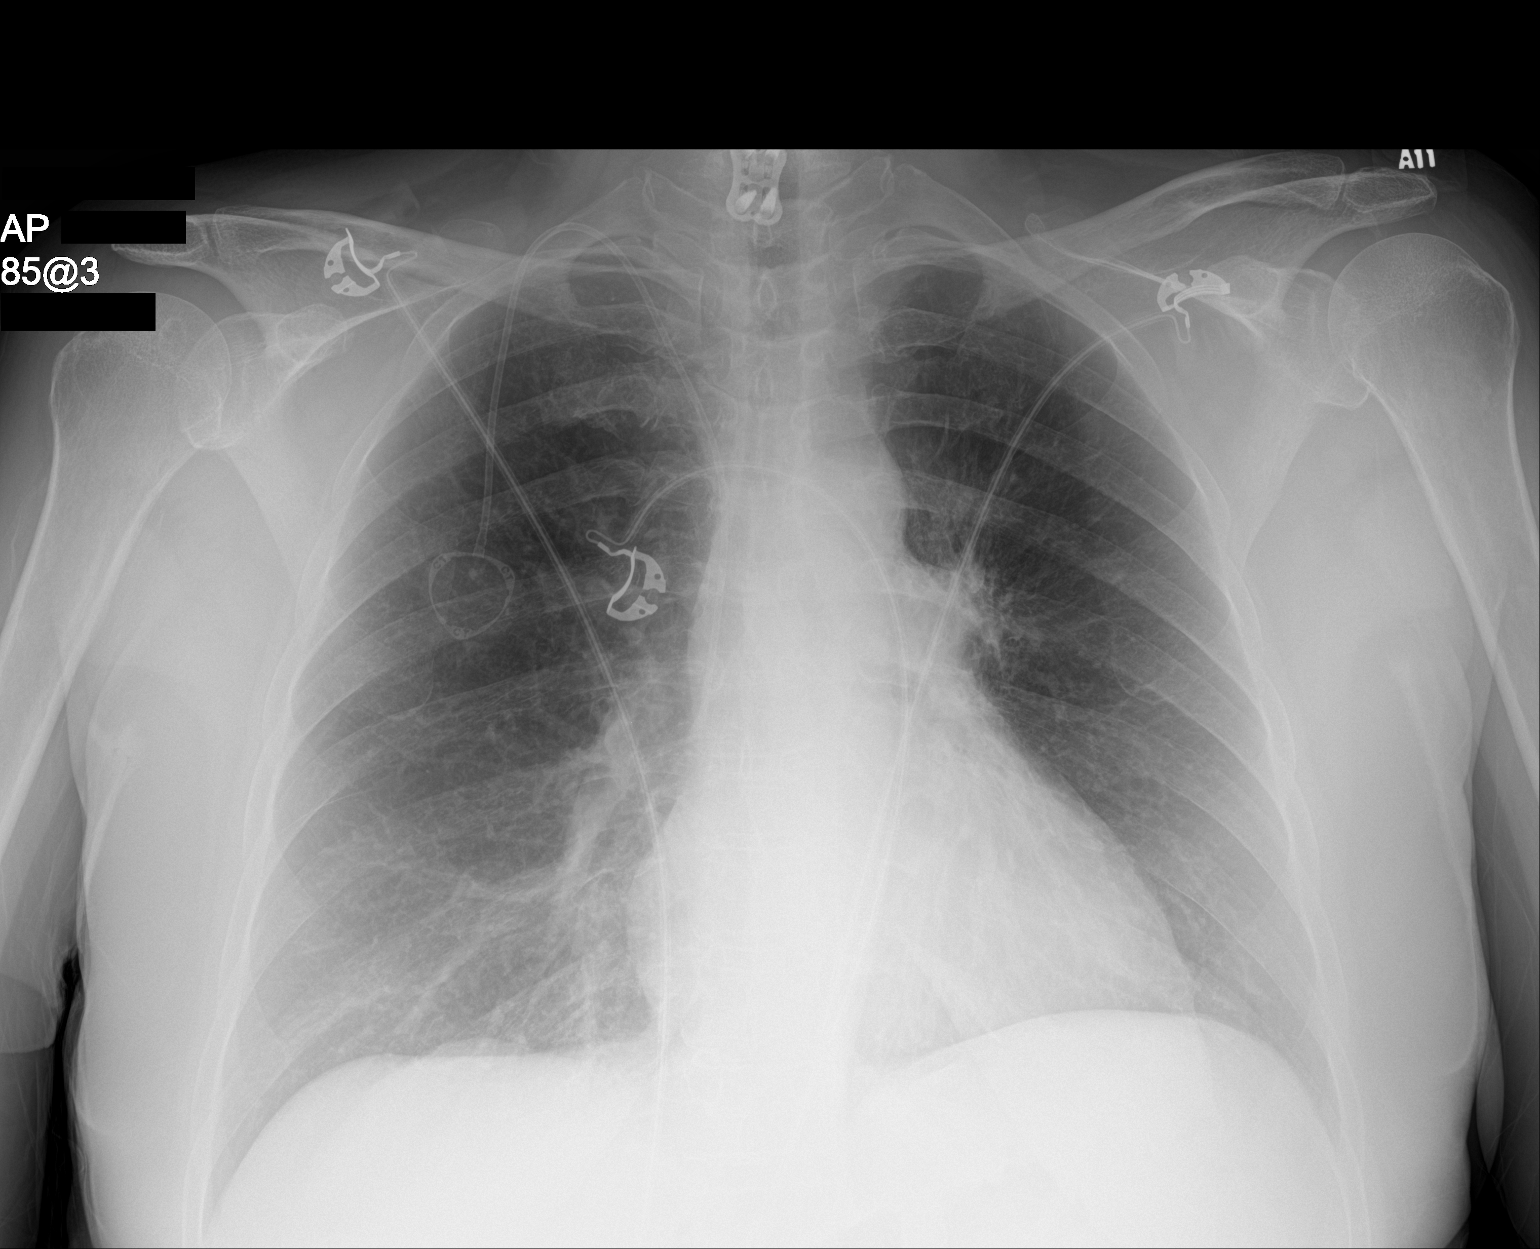

[1 of 1 positions shown; findings below may reference images not displayed]

FINDINGS: Port-A-Cath tip is in the superior vena cava. No pneumothorax. No
edema or consolidation. There is apparent left perihilar region
scarring. Heart is upper normal in size with pulmonary vascularity
within normal limits. No adenopathy. There is postoperative change
in the lower cervical region.
IMPRESSION: Port-A-Cath tip in superior vena cava. No pneumothorax. Left
perihilar region scarring. No edema or consolidation.

## 2022-11-23 ENCOUNTER — Other Ambulatory Visit: Payer: Self-pay | Admitting: Family Medicine

## 2022-11-23 DIAGNOSIS — Z1231 Encounter for screening mammogram for malignant neoplasm of breast: Secondary | ICD-10-CM
